# Patient Record
Sex: Female | Born: 1970 | Race: White | Hispanic: No | Marital: Married | Smoking: Never smoker
Health system: Southern US, Community
[De-identification: ages and names within clinical notes are randomized; demographics above are authoritative.]

---

## 1999-03-21 ENCOUNTER — Other Ambulatory Visit: Admission: RE | Admit: 1999-03-21 | Discharge: 1999-03-21 | Payer: Self-pay | Admitting: Obstetrics and Gynecology

## 2000-03-30 ENCOUNTER — Inpatient Hospital Stay (HOSPITAL_COMMUNITY): Admission: AD | Admit: 2000-03-30 | Discharge: 2000-04-01 | Payer: Self-pay | Admitting: Obstetrics and Gynecology

## 2000-04-30 ENCOUNTER — Other Ambulatory Visit: Admission: RE | Admit: 2000-04-30 | Discharge: 2000-04-30 | Payer: Self-pay | Admitting: Obstetrics and Gynecology

## 2000-12-15 ENCOUNTER — Other Ambulatory Visit: Admission: RE | Admit: 2000-12-15 | Discharge: 2000-12-15 | Payer: Self-pay | Admitting: Obstetrics and Gynecology

## 2001-06-01 ENCOUNTER — Other Ambulatory Visit: Admission: RE | Admit: 2001-06-01 | Discharge: 2001-06-01 | Payer: Self-pay | Admitting: Obstetrics and Gynecology

## 2002-02-16 ENCOUNTER — Inpatient Hospital Stay (HOSPITAL_COMMUNITY): Admission: AD | Admit: 2002-02-16 | Discharge: 2002-02-17 | Payer: Self-pay | Admitting: Obstetrics and Gynecology

## 2003-06-10 ENCOUNTER — Other Ambulatory Visit: Admission: RE | Admit: 2003-06-10 | Discharge: 2003-06-10 | Payer: Self-pay | Admitting: Obstetrics and Gynecology

## 2004-02-02 ENCOUNTER — Inpatient Hospital Stay (HOSPITAL_COMMUNITY): Admission: AD | Admit: 2004-02-02 | Discharge: 2004-02-03 | Payer: Self-pay | Admitting: Obstetrics and Gynecology

## 2005-05-17 ENCOUNTER — Other Ambulatory Visit: Admission: RE | Admit: 2005-05-17 | Discharge: 2005-05-17 | Payer: Self-pay | Admitting: Obstetrics and Gynecology

## 2011-03-19 ENCOUNTER — Ambulatory Visit (INDEPENDENT_AMBULATORY_CARE_PROVIDER_SITE_OTHER): Payer: BC Managed Care – PPO | Admitting: Family Medicine

## 2011-03-19 ENCOUNTER — Encounter: Payer: Self-pay | Admitting: Family Medicine

## 2011-03-19 VITALS — BP 129/80 | Ht 73.0 in | Wt 170.0 lb

## 2011-03-19 DIAGNOSIS — M25519 Pain in unspecified shoulder: Secondary | ICD-10-CM | POA: Insufficient documentation

## 2011-03-19 NOTE — Assessment & Plan Note (Addendum)
No evidence of rotator cuff or labral pathology.  Good strength.  Advised gradually working up to tennis frequency.  Given home exercise program.  Follow-up prn  Some scapular weakness, work on RTC and scapular stabilization

## 2011-03-19 NOTE — Progress Notes (Signed)
  Subjective:    Patient ID: Lynn Mcdowell, female    DOB: [birth year], 40 y.o.   MRN: [id number]  HPI New patient here for evaluation of 2 weeks of right shoulder pain.  Two weeks ago had increase in tennis- playing 4-5 times per week.  Dull ache in right shoulder and upper back area.  No pain while playing tennis but sore afterwards.  Has stopped playing for the past 1.5 weeks, with some improvement.  Poorly localized pain, has not had to take any medications.  No h/o dislocation  The PMH, PSH, Social History, Family History, Medications, and allergies have been reviewed in [facility], and have been updated if relevant.  Review of Systems REVIEW OF SYSTEMS  GEN: No fevers, chills. Nontoxic. Primarily MSK c/o today. MSK: Detailed in the HPI GI: tolerating PO intake without difficulty Neuro: No numbness, parasthesias, or tingling associated. Otherwise the pertinent positives of the ROS are noted above.      Objective:   Physical Exam   Neck:  Full range of motion, flexion, extension, lateral rotation without radiculopathy Shoulder: Inspection reveals no abnormalities, atrophy or asymmetry. Palpation is normal with no tenderness over AC joint or bicipital groove. ROM is full in all planes. Rotator cuff strength normal throughout. No signs of impingement with negative Neer and Hawkin's tests, empty can. Speeds and Yergason's tests normal. No labral pathology noted with negative Obrien's, negative clunk and good stability. Normal scapular function observed. No painful arc and no drop arm sign. No apprehension sign    Assessment & Plan:

## 2013-12-14 ENCOUNTER — Other Ambulatory Visit: Payer: Self-pay | Admitting: Obstetrics & Gynecology

## 2013-12-14 DIAGNOSIS — R928 Other abnormal and inconclusive findings on diagnostic imaging of breast: Secondary | ICD-10-CM

## 2013-12-24 ENCOUNTER — Ambulatory Visit
Admission: RE | Admit: 2013-12-24 | Discharge: 2013-12-24 | Disposition: A | Discharge status: Home / Self Care | Payer: Managed Care, Other (non HMO) | Source: Ambulatory Visit | Attending: Obstetrics & Gynecology | Admitting: Obstetrics & Gynecology

## 2013-12-24 DIAGNOSIS — R928 Other abnormal and inconclusive findings on diagnostic imaging of breast: Secondary | ICD-10-CM

## 2015-02-09 ENCOUNTER — Other Ambulatory Visit: Payer: Self-pay | Admitting: Gastroenterology

## 2015-02-09 DIAGNOSIS — R1011 Right upper quadrant pain: Secondary | ICD-10-CM

## 2015-02-09 DIAGNOSIS — R110 Nausea: Secondary | ICD-10-CM

## 2015-03-01 ENCOUNTER — Ambulatory Visit (HOSPITAL_COMMUNITY)
Admission: RE | Admit: 2015-03-01 | Discharge: 2015-03-01 | Disposition: A | Discharge status: Home / Self Care | Payer: Managed Care, Other (non HMO) | Source: Ambulatory Visit | Attending: Gastroenterology | Admitting: Gastroenterology

## 2015-03-01 ENCOUNTER — Encounter (HOSPITAL_COMMUNITY)
Admission: RE | Admit: 2015-03-01 | Discharge: 2015-03-01 | Disposition: A | Discharge status: Home / Self Care | Payer: Managed Care, Other (non HMO) | Source: Ambulatory Visit | Attending: Gastroenterology | Admitting: Gastroenterology

## 2015-03-01 DIAGNOSIS — R1011 Right upper quadrant pain: Secondary | ICD-10-CM | POA: Diagnosis not present

## 2015-03-01 DIAGNOSIS — R110 Nausea: Secondary | ICD-10-CM | POA: Diagnosis present

## 2015-03-01 MED ORDER — TECHNETIUM TC 99M MEBROFENIN IV KIT
5.5000 | PACK | Freq: Once | INTRAVENOUS | Status: AC | PRN
  Administered 2015-03-01: 5.5 via INTRAVENOUS

## 2015-03-01 MED ORDER — SINCALIDE 5 MCG IJ SOLR
0.0200 ug/kg | Freq: Once | INTRAMUSCULAR | Status: AC
  Administered 2015-03-01: 1.5 ug via INTRAVENOUS

## 2015-03-03 ENCOUNTER — Ambulatory Visit (HOSPITAL_COMMUNITY): Payer: Managed Care, Other (non HMO)

## 2015-06-26 ENCOUNTER — Other Ambulatory Visit: Payer: Self-pay | Admitting: Obstetrics & Gynecology

## 2015-06-26 DIAGNOSIS — N6320 Unspecified lump in the left breast, unspecified quadrant: Secondary | ICD-10-CM

## 2015-06-30 ENCOUNTER — Ambulatory Visit
Admission: RE | Admit: 2015-06-30 | Discharge: 2015-06-30 | Disposition: A | Discharge status: Home / Self Care | Payer: Managed Care, Other (non HMO) | Source: Ambulatory Visit | Attending: Obstetrics & Gynecology | Admitting: Obstetrics & Gynecology

## 2015-06-30 DIAGNOSIS — N6320 Unspecified lump in the left breast, unspecified quadrant: Secondary | ICD-10-CM

## 2016-05-02 IMAGING — US US BREAST LTD UNI LEFT INC AXILLA
1 series · 5 of 5 positions shown · non-contrast
Comparison: Previous exam(s).

CLINICAL DATA: 43-year-old female with a palpable abnormality in
the left breast.

EXAM:
DIGITAL DIAGNOSTIC LEFT MAMMOGRAM WITH 3D TOMOSYNTHESIS AND CAD
LEFT BREAST ULTRASOUND

[Series 1: us breast ltd uni left inc axilla · 0.07mm/px · 5 of 5 slices shown]
[im 1/5]
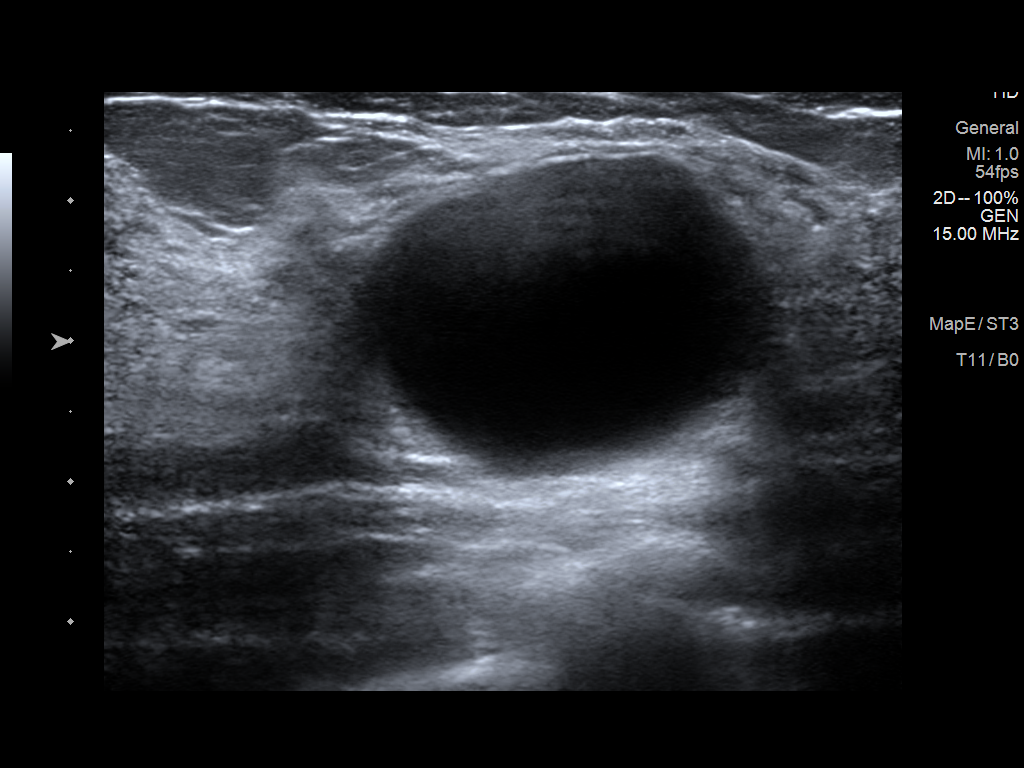
[im 2/5]
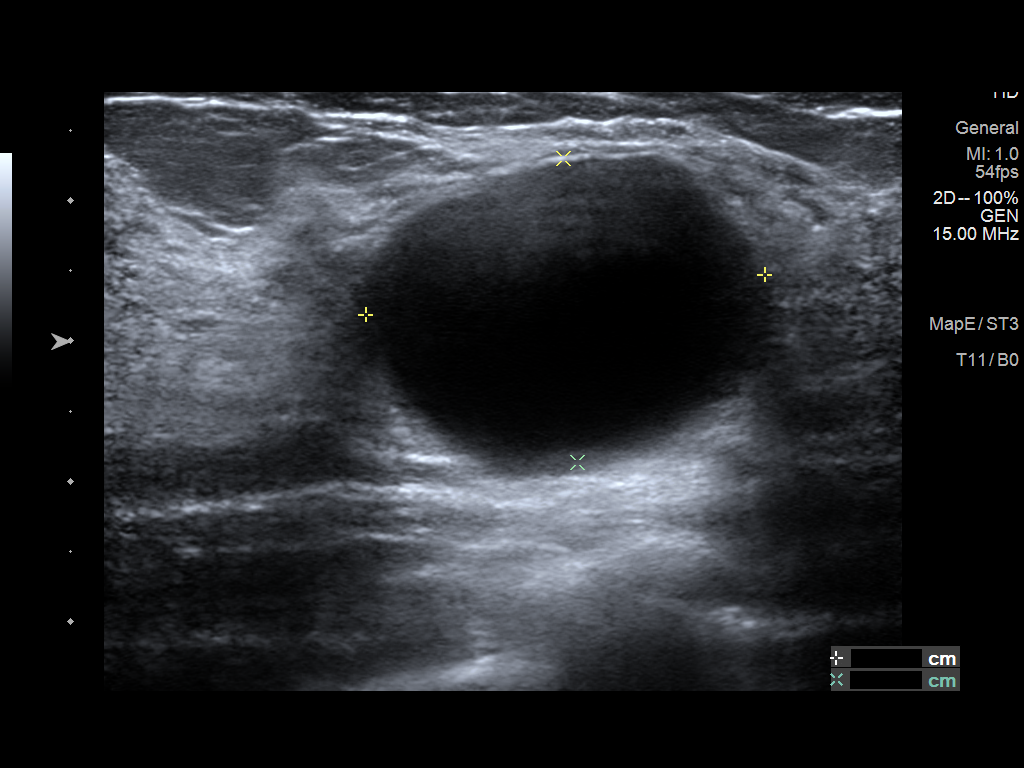
[im 3/5]
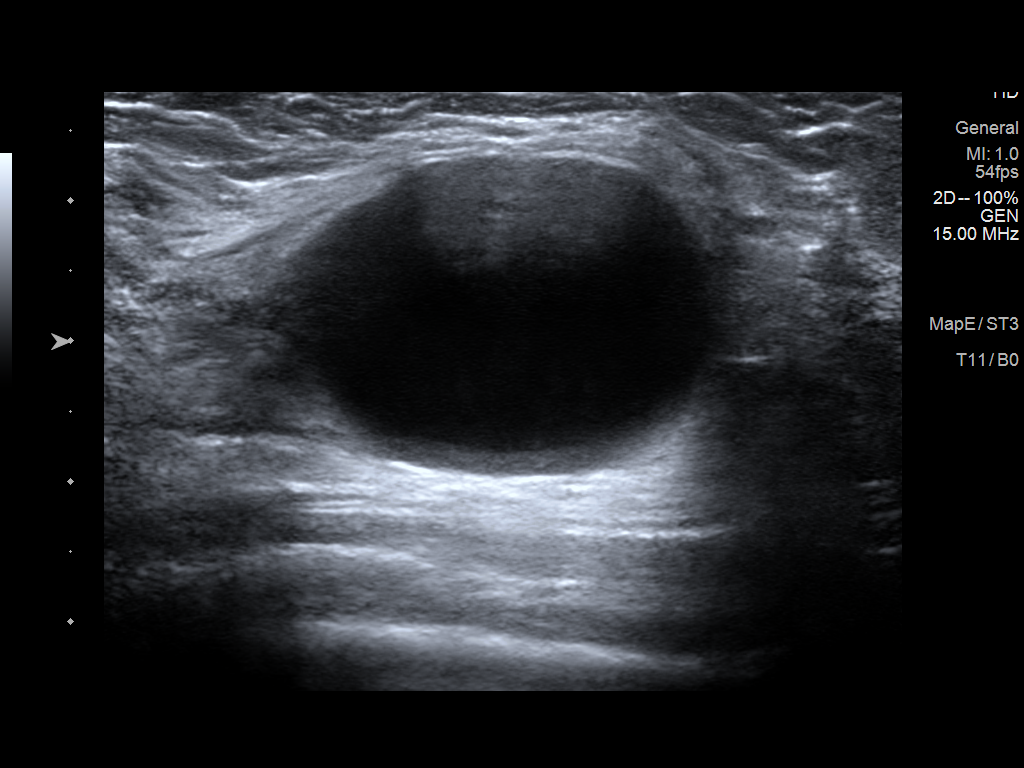
[im 4/5]
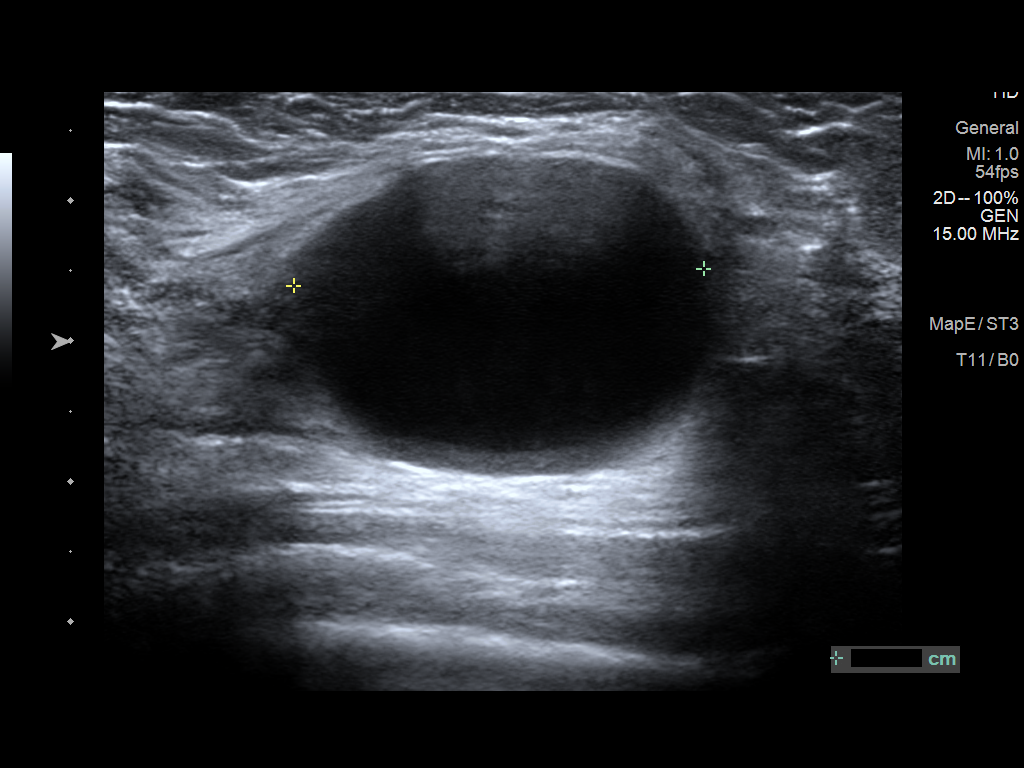
[im 5/5]
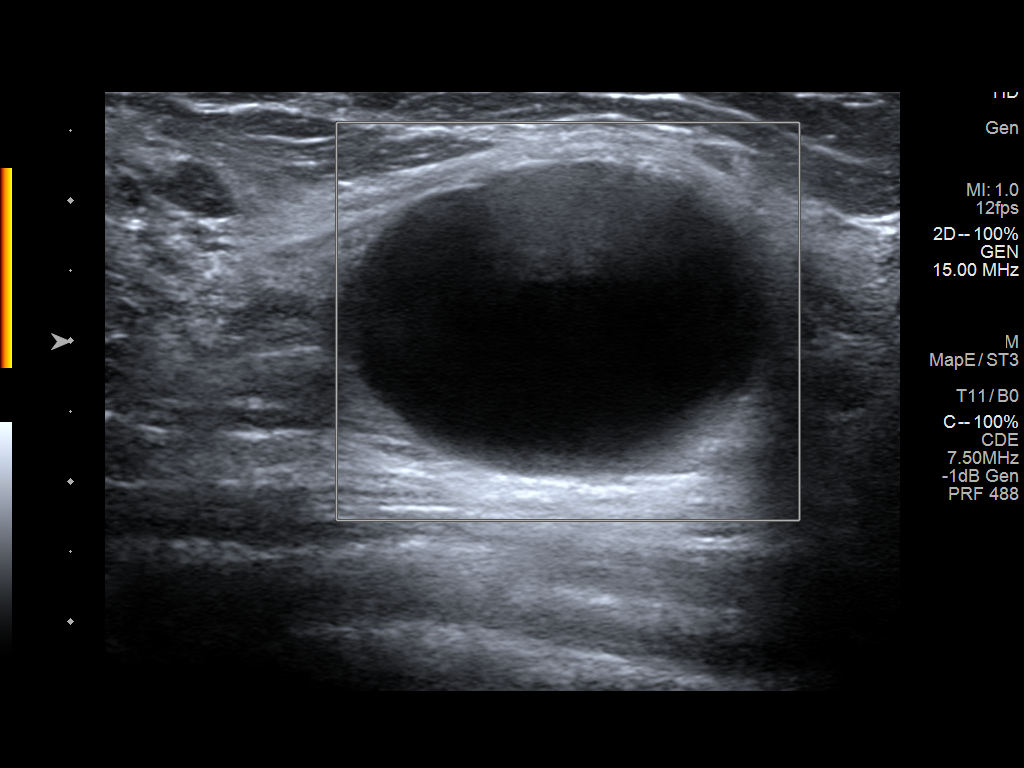

[5 of 5 positions shown; findings below may reference images not displayed]

ACR Breast Density Category d: The breast tissue is extremely dense,
which lowers the sensitivity of mammography.
FINDINGS: No suspicious masses or calcifications are seen in the left breast.
Spot compression tangential tomosynthesis was performed over the
palpable site of concern in the upper-outer left breast
demonstrating an oval well-circumscribed mass measuring
approximately 3 cm and previously characterized as a simple cyst.

Mammographic images were processed with CAD.

Physical examination of the upper-outer left breast reveals a firm
highly mobile mass at the approximate 2 to 3 o'clock position.

Targeted ultrasound of the left breast was performed demonstrating
an oval well-circumscribed anechoic mass with posterior acoustic
enhancement compatible with a cyst measuring 2.9 x 2.2 x 2.9 cm.
This corresponds with mammography findings.
IMPRESSION: Left breast cyst. There is no mammographic evidence of malignancy in
the left breast.

RECOMMENDATION:
Recommend annual routine screening mammography, for which the
patient will be due January 2016.

I have discussed the findings and recommendations with the patient.
Results were also provided in writing at the conclusion of the
visit. If applicable, a reminder letter will be sent to the patient
regarding the next appointment.

BI-RADS CATEGORY  2: Benign.

## 2019-12-18 ENCOUNTER — Ambulatory Visit: Payer: Managed Care, Other (non HMO) | Attending: Internal Medicine

## 2019-12-18 DIAGNOSIS — Z23 Encounter for immunization: Secondary | ICD-10-CM

## 2019-12-18 NOTE — Progress Notes (Signed)
   Covid-19 Vaccination Clinic  Name:  Lynn Mcdowell    MRN: [id number] DOB: 04/02/1971  12/18/2019  Lynn Mcdowell was observed post Covid-19 immunization for 15 minutes without incidence. She was provided with Vaccine Information Sheet and instruction to access the V-Safe system.   Lynn Mcdowell was instructed to call 911 with any severe reactions post vaccine: Marland Kitchen Difficulty breathing  . Swelling of your face and throat  . A fast heartbeat  . A bad rash all over your body  . Dizziness and weakness    Immunizations Administered    Name Date Dose VIS Date Route   Pfizer COVID-19 Vaccine 12/18/2019  3:22 PM 0.3 mL 10/15/2019 Intramuscular   Manufacturer: [employer], [employer]   Lot: [id number]   NDC: [id number]

## 2020-01-11 ENCOUNTER — Ambulatory Visit: Payer: Managed Care, Other (non HMO) | Attending: Internal Medicine

## 2020-01-11 DIAGNOSIS — Z23 Encounter for immunization: Secondary | ICD-10-CM | POA: Insufficient documentation

## 2020-01-11 NOTE — Progress Notes (Signed)
   Covid-19 Vaccination Clinic  Name:  Lynn Mcdowell    MRN: [id number] DOB: Apr 03, 1971  01/11/2020  [name] was observed post Covid-19 immunization for 15 minutes without incident. She was provided with Vaccine Information Sheet and instruction to access the V-Safe system.   [name] was instructed to call 911 with any severe reactions post vaccine: Marland Kitchen Difficulty breathing  . Swelling of face and throat  . A fast heartbeat  . A bad rash all over body  . Dizziness and weakness   Immunizations Administered    Name Date Dose VIS Date Route   Pfizer COVID-19 Vaccine 01/11/2020  8:20 AM 0.3 mL 10/15/2019 Intramuscular   Manufacturer: [employer], [employer]   Lot: [id number]   NDC: [address]-8

## 2022-04-16 ENCOUNTER — Other Ambulatory Visit (HOSPITAL_COMMUNITY): Payer: Self-pay | Admitting: Gastroenterology

## 2022-04-16 ENCOUNTER — Other Ambulatory Visit: Payer: Self-pay | Admitting: Gastroenterology

## 2022-04-16 DIAGNOSIS — R1011 Right upper quadrant pain: Secondary | ICD-10-CM

## 2022-04-17 ENCOUNTER — Ambulatory Visit (HOSPITAL_COMMUNITY)
Admission: RE | Admit: 2022-04-17 | Discharge: 2022-04-17 | Disposition: A | Discharge status: Home / Self Care | Payer: Managed Care, Other (non HMO) | Source: Ambulatory Visit | Attending: Gastroenterology | Admitting: Gastroenterology

## 2022-04-17 DIAGNOSIS — R1011 Right upper quadrant pain: Secondary | ICD-10-CM | POA: Diagnosis present

## 2022-04-25 ENCOUNTER — Ambulatory Visit (HOSPITAL_COMMUNITY): Payer: Managed Care, Other (non HMO)

## 2022-05-10 ENCOUNTER — Ambulatory Visit (HOSPITAL_COMMUNITY)
Admission: RE | Admit: 2022-05-10 | Discharge: 2022-05-10 | Disposition: A | Discharge status: Home / Self Care | Payer: Managed Care, Other (non HMO) | Source: Ambulatory Visit | Attending: Gastroenterology | Admitting: Gastroenterology

## 2022-05-10 DIAGNOSIS — R1011 Right upper quadrant pain: Secondary | ICD-10-CM | POA: Diagnosis present

## 2022-05-10 MED ORDER — TECHNETIUM TC 99M MEBROFENIN IV KIT
5.0000 | PACK | Freq: Once | INTRAVENOUS | Status: AC | PRN
  Administered 2022-05-10: 5 via INTRAVENOUS

## 2023-02-18 IMAGING — US US ABDOMEN LIMITED
1 series · 14 of 25 positions shown · non-contrast
Comparison: Abdominal ultrasound 03/01/2015

CLINICAL DATA: Right upper quadrant pain

EXAM:
ULTRASOUND ABDOMEN LIMITED RIGHT UPPER QUADRANT

[Series 1: us abdomen limited ruq (liver/gb) · 14 of 76 slices shown]
[im 1/76]
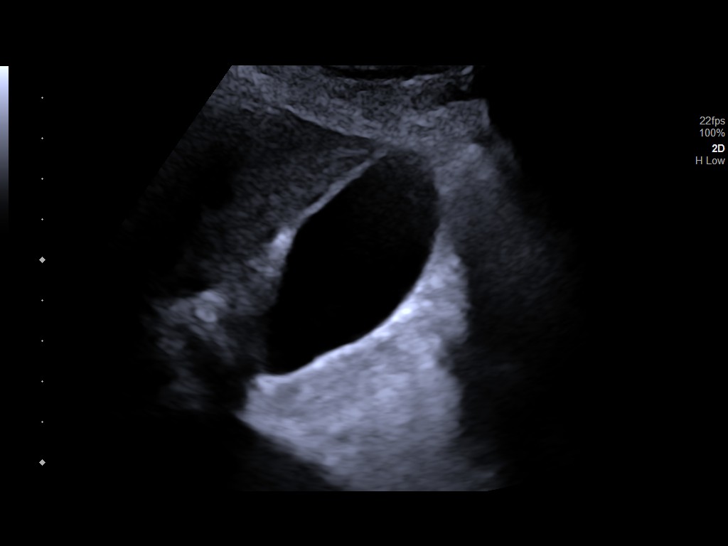
[im 7/76]
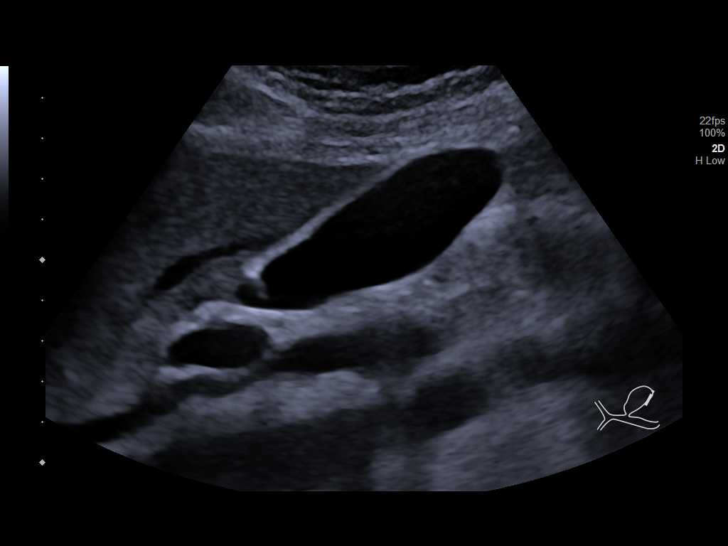
[im 13/76]
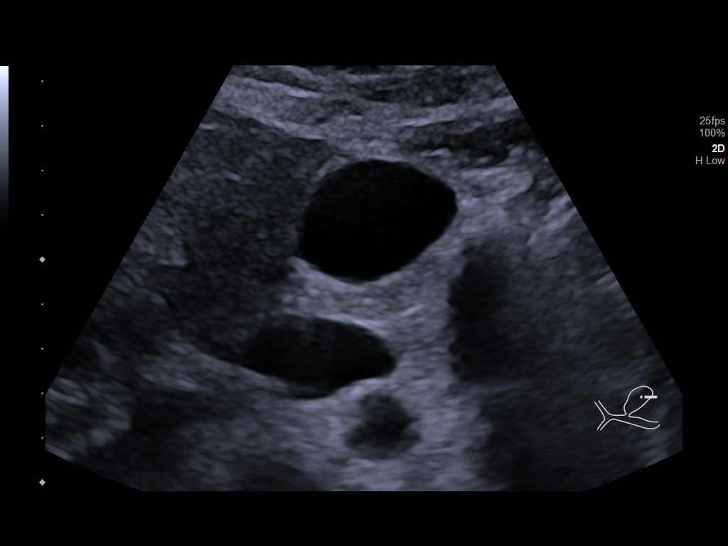
[im 19/76]
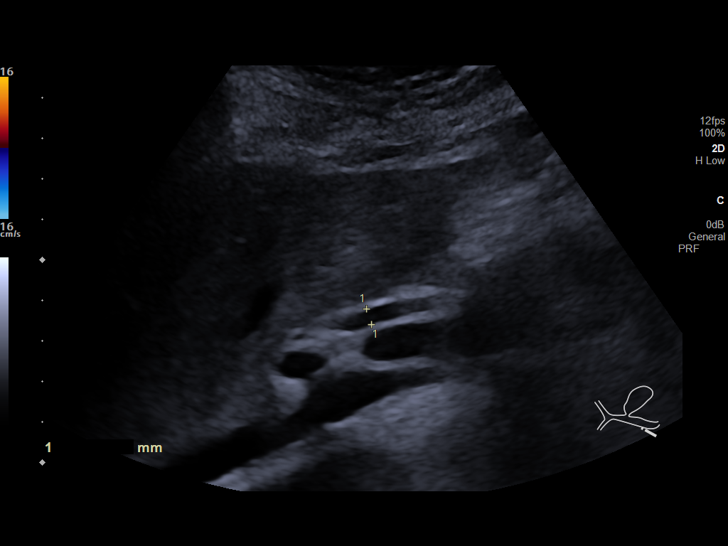
[im 26/76]
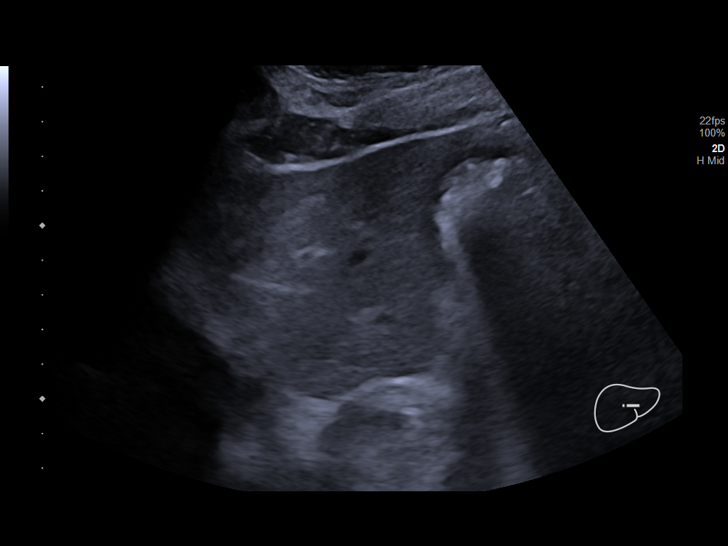
[im 29/76]
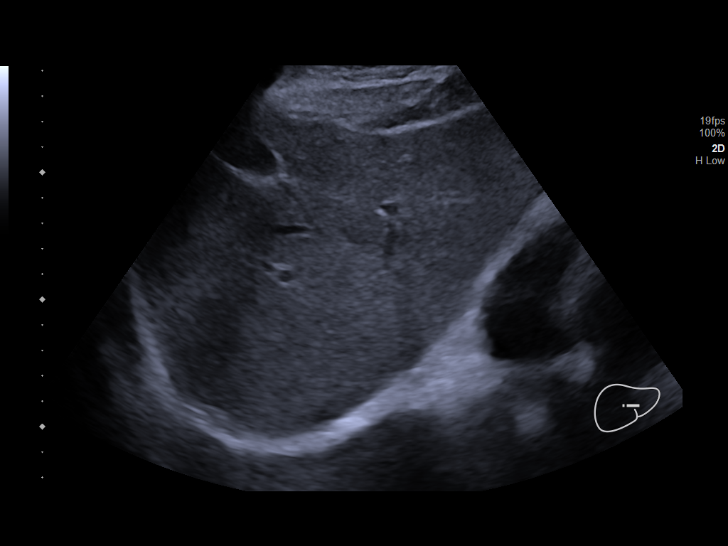
[im 35/76]
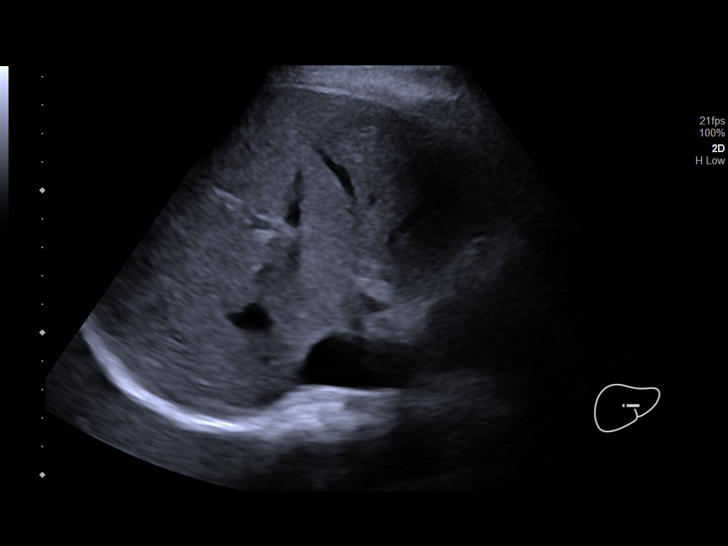
[im 41/76]
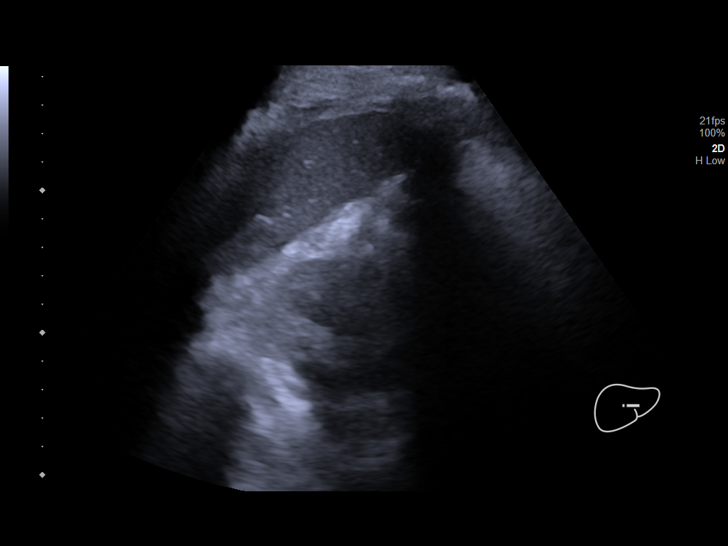
[im 47/76]
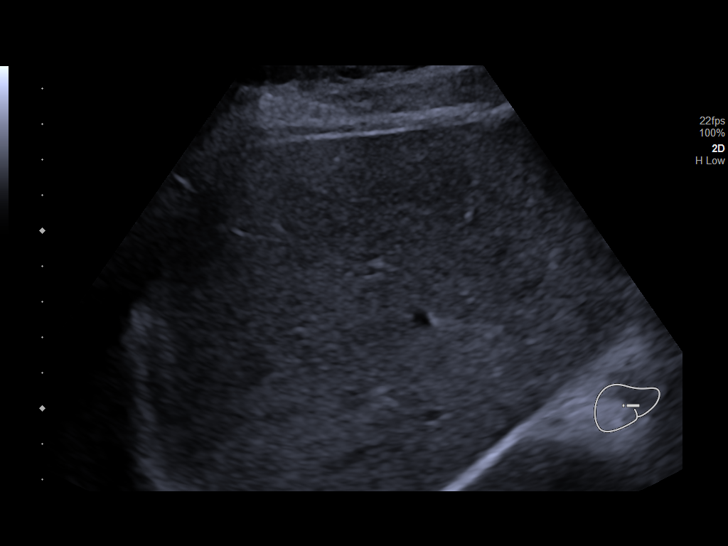
[im 51/76]
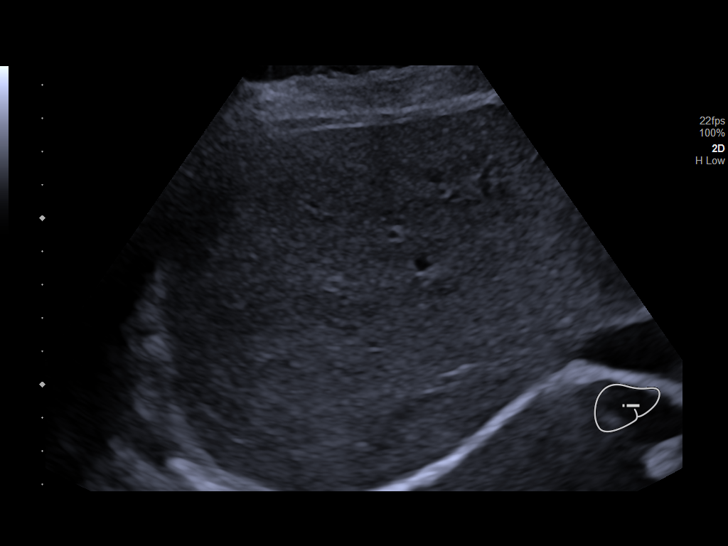
[im 57/76]
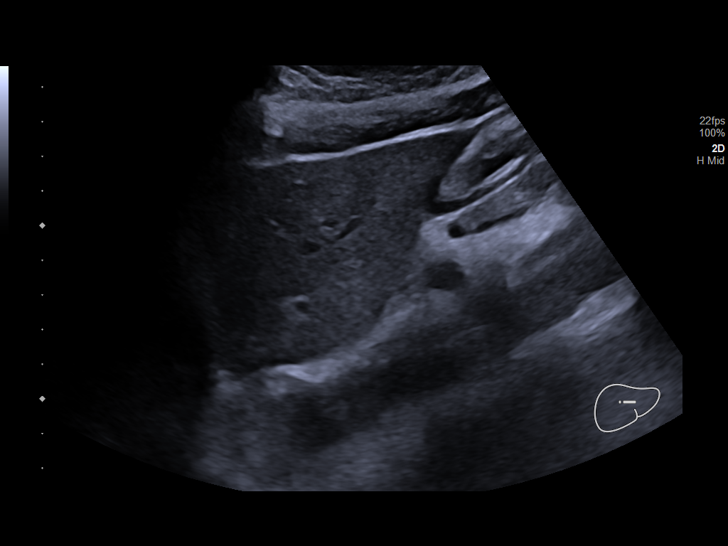
[im 63/76]
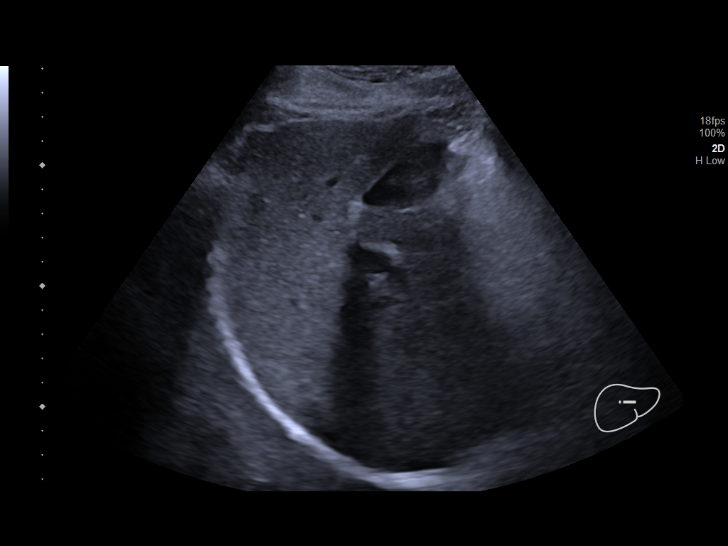
[im 69/76]
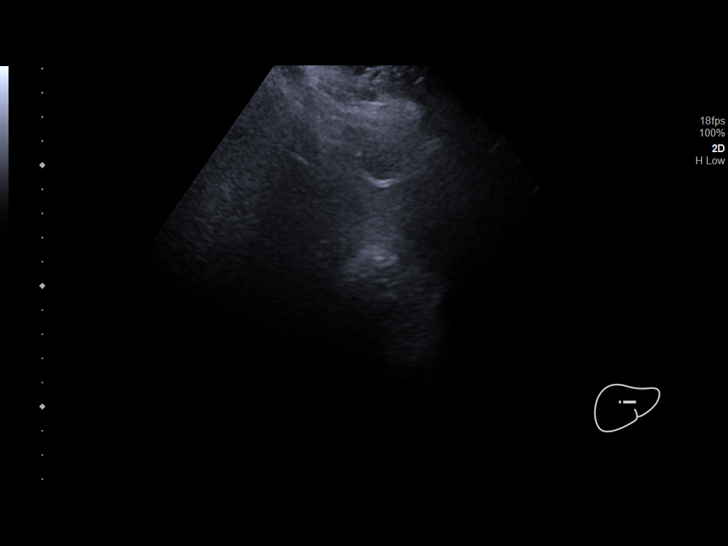
[im 76/76]
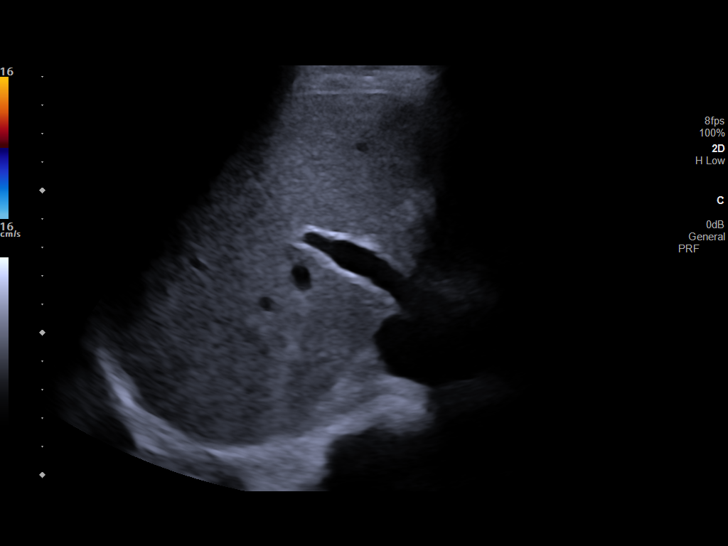

[14 of 25 positions shown; findings below may reference images not displayed]

FINDINGS: Gallbladder:

No gallstones or wall thickening visualized. No sonographic Murphy
sign noted by sonographer.

Common bile duct:

Diameter: 4 mm

Liver:

No focal lesion identified. Within normal limits in parenchymal
echogenicity. Portal vein is patent on color Doppler imaging with
normal direction of blood flow towards the liver.

Other: None.
IMPRESSION: No acute process identified.
